# Patient Record
Sex: Female | Born: 1940 | Race: Black or African American | Hispanic: No | Smoking: Former smoker
Health system: Southern US, Community
[De-identification: ages and names within clinical notes are randomized; demographics above are authoritative.]

## PROBLEM LIST (undated history)

## (undated) DIAGNOSIS — I10 Essential (primary) hypertension: Secondary | ICD-10-CM

## (undated) HISTORY — PX: NECK SURGERY: SHX720

## (undated) HISTORY — PX: BACK SURGERY: SHX140

---

## 2004-06-09 ENCOUNTER — Ambulatory Visit: Payer: Self-pay | Admitting: Internal Medicine

## 2005-02-03 ENCOUNTER — Emergency Department: Payer: Self-pay | Admitting: Emergency Medicine

## 2005-06-26 ENCOUNTER — Emergency Department: Payer: Self-pay | Admitting: Emergency Medicine

## 2005-07-14 DIAGNOSIS — K219 Gastro-esophageal reflux disease without esophagitis: Secondary | ICD-10-CM | POA: Insufficient documentation

## 2005-07-30 ENCOUNTER — Other Ambulatory Visit: Payer: Self-pay

## 2005-07-31 ENCOUNTER — Inpatient Hospital Stay: Payer: Self-pay | Admitting: Internal Medicine

## 2006-02-05 ENCOUNTER — Emergency Department: Payer: Self-pay | Admitting: Emergency Medicine

## 2006-05-30 IMAGING — CT CT ABD-PELV W/ CM
1 of 2 series · 15 of 32 positions shown, 19 images · non-contrast
Comparison: none

REASON FOR EXAM: Abdominal pain
COMMENTS:

[Series 3: abdomen · axial · 0.67mm/px · z∈[-352,+24]mm · 15 of 51 slices shown, 19 images]
[im 2/51  soft-tissue]
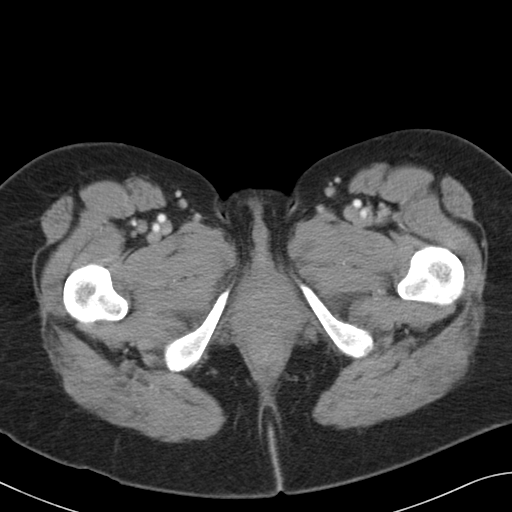
[im 2/51  bone]
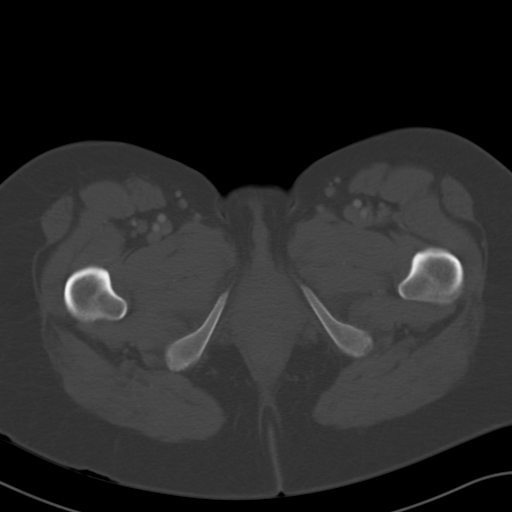
[im 6/51  soft-tissue]
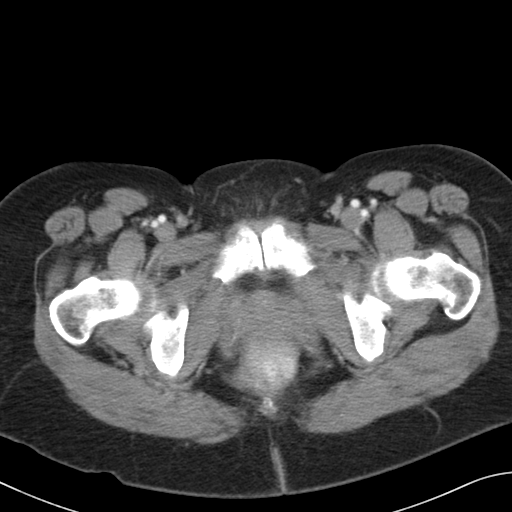
[im 10/51  soft-tissue]
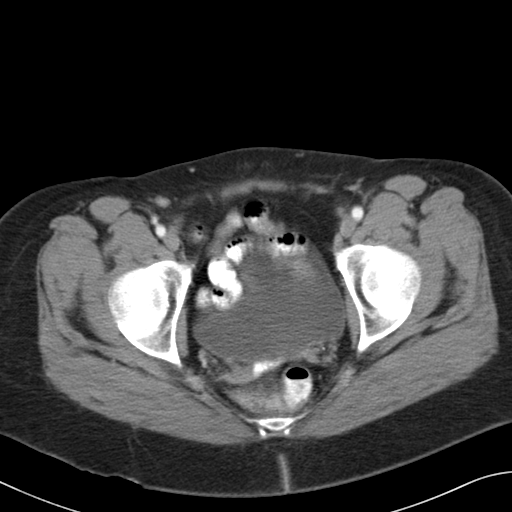
[im 14/51  soft-tissue]
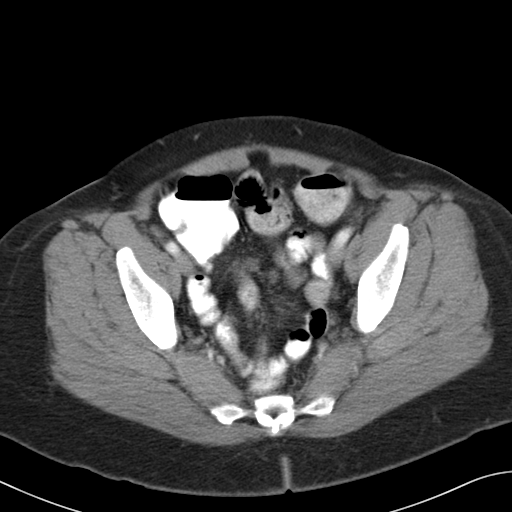
[im 18/51  soft-tissue]
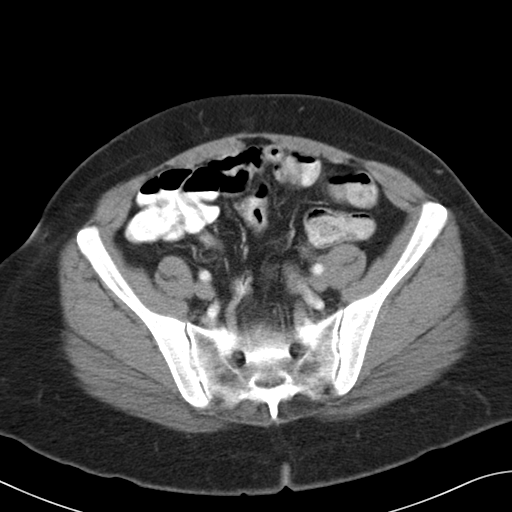
[im 22/51  soft-tissue]
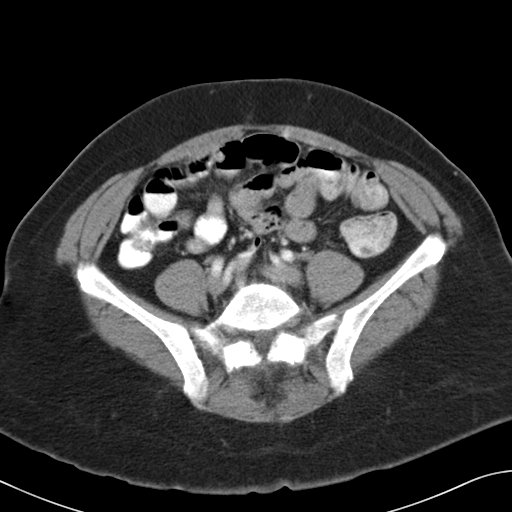
[im 26/51  soft-tissue]
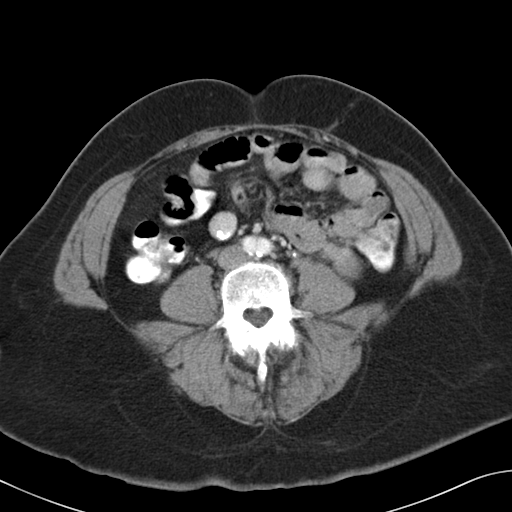
[im 29/51  soft-tissue]
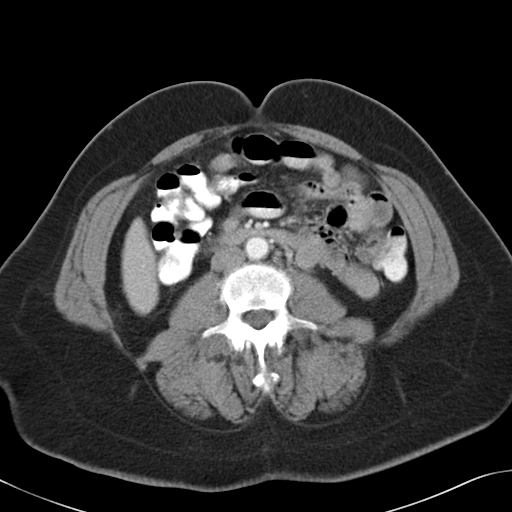
[im 33/51  soft-tissue]
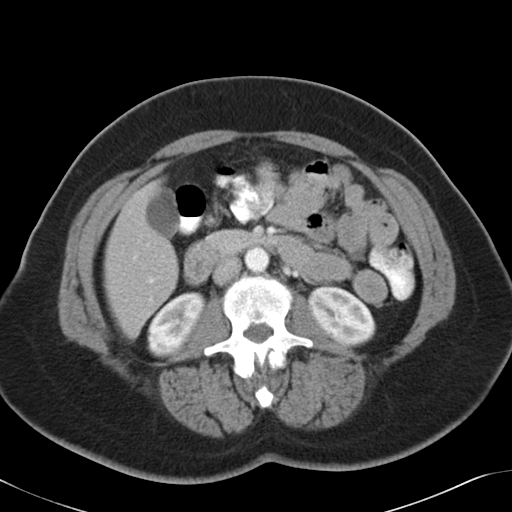
[im 33/51  bone]
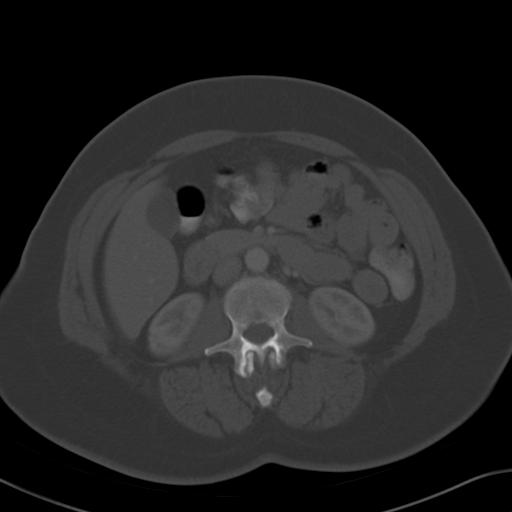
[im 37/51  soft-tissue]
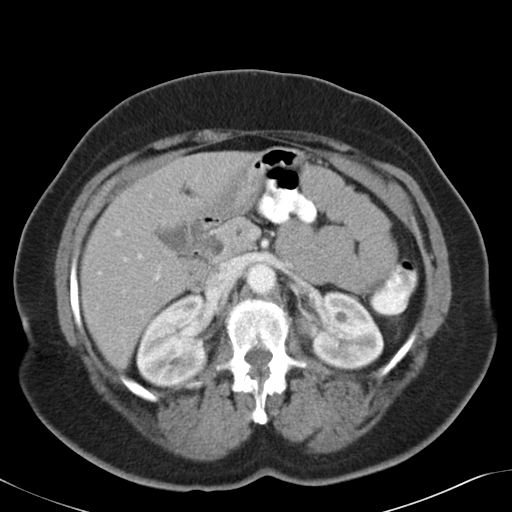
[im 41/51  soft-tissue]
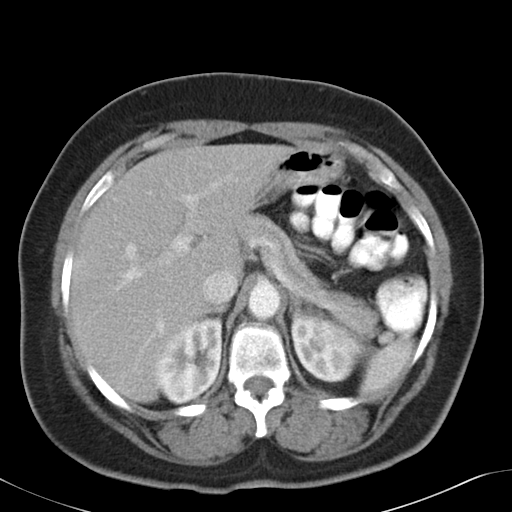
[im 43/51  lung]
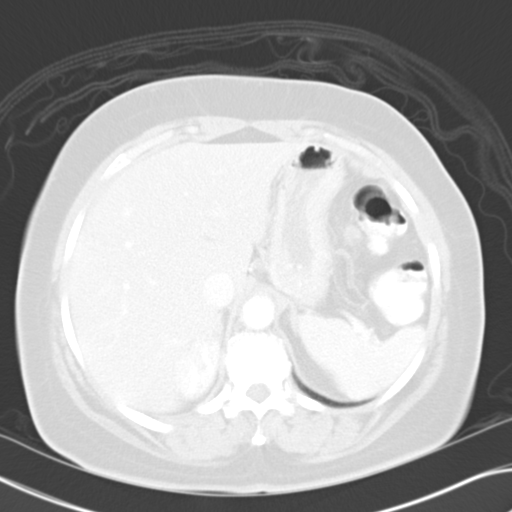
[im 45/51  soft-tissue]
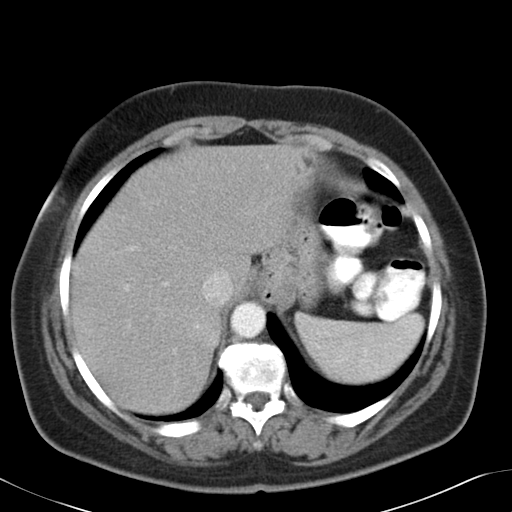
[im 45/51  lung]
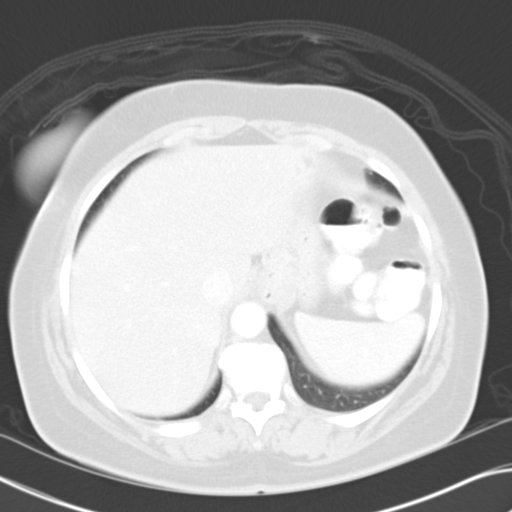
[im 47/51  lung]
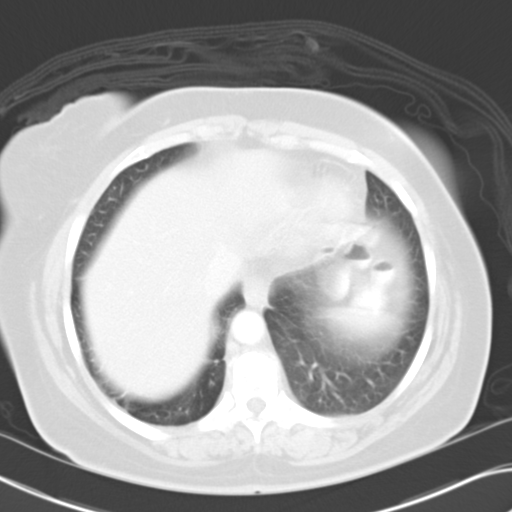
[im 49/51  soft-tissue]
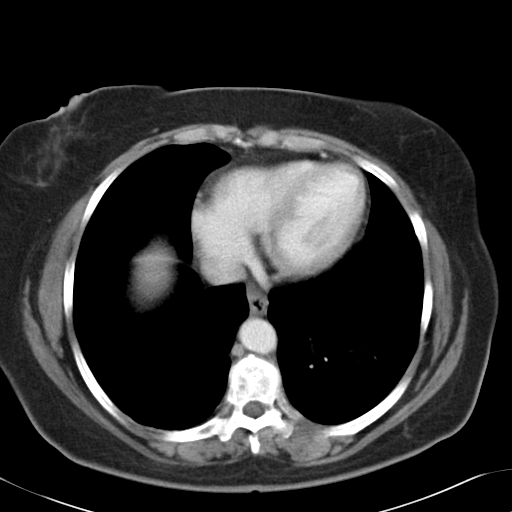
[im 49/51  lung]
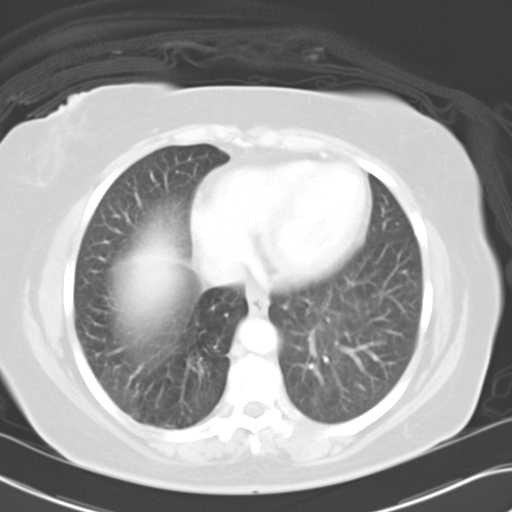

[15 of 32 positions shown; findings below may reference images not displayed]

PROCEDURE:     CT  - CT ABDOMEN / PELVIS  W  - July 31, 2005  [DATE]

RESULT:     The patient is complaining of abdominal discomfort. The patient
has undergone prior hysterectomy.

The patient received 100 ml of [id number] for this study.

The liver is normal in density with no mass or ductal dilation. The
gallbladder is adequately distended with no evidence of calcified stones.
The pancreas, spleen, stomach, adrenal glands and kidneys are normal. The
periaortic and pericaval regions also are normal. The caliber of the
thoracic aorta is normal. The urinary bladder is partially distended and
grossly normal. The partially contrast filled loops of small and large bowel
exhibit no acute abnormality. The lung bases are clear.
IMPRESSION: 1.     I do not see evidence of acute intra-abdominal abnormality.
Specifically, I do not see evidence of acute diverticulitis or appendicitis
or other acute bowel abnormality.
2.     There are no findings to suggest acute hepatobiliary disease.
3.     I see no acute abnormality of the kidneys with no evidence of
obstruction of the urinary tracts seen either.

A preliminary report was sent to the Emergency Room at the conclusion of the
study.

## 2006-05-31 ENCOUNTER — Ambulatory Visit: Payer: Self-pay | Admitting: Urology

## 2007-06-01 ENCOUNTER — Other Ambulatory Visit: Payer: Self-pay

## 2007-06-01 ENCOUNTER — Observation Stay: Payer: Self-pay | Admitting: Internal Medicine

## 2007-11-04 ENCOUNTER — Ambulatory Visit: Payer: Self-pay | Admitting: Internal Medicine

## 2009-08-15 ENCOUNTER — Ambulatory Visit: Payer: Self-pay | Admitting: Internal Medicine

## 2009-10-21 ENCOUNTER — Emergency Department: Payer: Self-pay | Admitting: Emergency Medicine

## 2009-11-13 DIAGNOSIS — M4712 Other spondylosis with myelopathy, cervical region: Secondary | ICD-10-CM | POA: Insufficient documentation

## 2013-11-17 ENCOUNTER — Ambulatory Visit: Payer: Self-pay | Admitting: Family Medicine

## 2014-11-08 ENCOUNTER — Other Ambulatory Visit: Payer: Self-pay | Admitting: Family Medicine

## 2014-11-08 DIAGNOSIS — R1011 Right upper quadrant pain: Secondary | ICD-10-CM

## 2014-11-14 ENCOUNTER — Ambulatory Visit
Admission: RE | Admit: 2014-11-14 | Discharge: 2014-11-14 | Disposition: A | Discharge status: Home / Self Care | Payer: Medicare Other | Source: Ambulatory Visit | Attending: Family Medicine | Admitting: Family Medicine

## 2014-11-14 DIAGNOSIS — K219 Gastro-esophageal reflux disease without esophagitis: Secondary | ICD-10-CM | POA: Insufficient documentation

## 2014-11-14 DIAGNOSIS — R1011 Right upper quadrant pain: Secondary | ICD-10-CM

## 2015-03-31 ENCOUNTER — Emergency Department: Payer: Medicare Other

## 2015-03-31 ENCOUNTER — Encounter: Payer: Self-pay | Admitting: Emergency Medicine

## 2015-03-31 ENCOUNTER — Emergency Department
Admission: EM | Admit: 2015-03-31 | Discharge: 2015-03-31 | Disposition: A | Discharge status: Home / Self Care | Payer: Medicare Other | Attending: Emergency Medicine | Admitting: Emergency Medicine

## 2015-03-31 DIAGNOSIS — J40 Bronchitis, not specified as acute or chronic: Secondary | ICD-10-CM

## 2015-03-31 DIAGNOSIS — R0602 Shortness of breath: Secondary | ICD-10-CM | POA: Diagnosis present

## 2015-03-31 DIAGNOSIS — I10 Essential (primary) hypertension: Secondary | ICD-10-CM | POA: Insufficient documentation

## 2015-03-31 DIAGNOSIS — Z87891 Personal history of nicotine dependence: Secondary | ICD-10-CM | POA: Diagnosis not present

## 2015-03-31 DIAGNOSIS — R0600 Dyspnea, unspecified: Secondary | ICD-10-CM

## 2015-03-31 DIAGNOSIS — J209 Acute bronchitis, unspecified: Secondary | ICD-10-CM | POA: Insufficient documentation

## 2015-03-31 HISTORY — DX: Essential (primary) hypertension: I10

## 2015-03-31 LAB — CBC WITH DIFFERENTIAL/PLATELET
BASOS ABS: 0.1 10*3/uL (ref 0–0.1)
Basophils Relative: 1 %
Eosinophils Absolute: 0.1 10*3/uL (ref 0–0.7)
Eosinophils Relative: 1 %
HEMATOCRIT: 42 % (ref 35.0–47.0)
Hemoglobin: 13.5 g/dL (ref 12.0–16.0)
LYMPHS PCT: 24 %
Lymphs Abs: 2.7 10*3/uL (ref 1.0–3.6)
MCH: 26.3 pg (ref 26.0–34.0)
MCHC: 32.1 g/dL (ref 32.0–36.0)
MCV: 81.7 fL (ref 80.0–100.0)
MONO ABS: 0.6 10*3/uL (ref 0.2–0.9)
MONOS PCT: 6 %
NEUTROS ABS: 7.6 10*3/uL — AB (ref 1.4–6.5)
Neutrophils Relative %: 68 %
Platelets: 213 10*3/uL (ref 150–440)
RBC: 5.14 MIL/uL (ref 3.80–5.20)
RDW: 14.1 % (ref 11.5–14.5)
WBC: 11.1 10*3/uL — ABNORMAL HIGH (ref 3.6–11.0)

## 2015-03-31 LAB — COMPREHENSIVE METABOLIC PANEL
ALK PHOS: 111 U/L (ref 38–126)
ALT: 17 U/L (ref 14–54)
AST: 28 U/L (ref 15–41)
Albumin: 3.9 g/dL (ref 3.5–5.0)
Anion gap: 7 (ref 5–15)
BUN: 9 mg/dL (ref 6–20)
CALCIUM: 9.3 mg/dL (ref 8.9–10.3)
CO2: 25 mmol/L (ref 22–32)
CREATININE: 0.78 mg/dL (ref 0.44–1.00)
Chloride: 105 mmol/L (ref 101–111)
GFR calc non Af Amer: >60 mL/min (ref >60)
GLUCOSE: 149 mg/dL — AB (ref 65–99)
Potassium: 2.8 mmol/L — CL (ref 3.5–5.1)
SODIUM: 137 mmol/L (ref 135–145)
Total Bilirubin: 0.8 mg/dL (ref 0.3–1.2)
Total Protein: 7.5 g/dL (ref 6.5–8.1)

## 2015-03-31 LAB — TROPONIN I: Troponin I: <0.03 ng/mL (ref <0.031)

## 2015-03-31 LAB — BRAIN NATRIURETIC PEPTIDE: B Natriuretic Peptide: 46 pg/mL (ref 0.0–100.0)

## 2015-03-31 MED ORDER — LEVOFLOXACIN 500 MG PO TABS: 500.0000 mg | ORAL_TABLET | Freq: Every day | ORAL | Status: AC

## 2015-03-31 MED ORDER — IPRATROPIUM-ALBUTEROL 0.5-2.5 (3) MG/3ML IN SOLN
3.0000 mL | Freq: Once | RESPIRATORY_TRACT | Status: AC
  Administered 2015-03-31: 3 mL via RESPIRATORY_TRACT

## 2015-03-31 MED ORDER — IPRATROPIUM-ALBUTEROL 0.5-2.5 (3) MG/3ML IN SOLN
3.0000 mL | Freq: Once | RESPIRATORY_TRACT | Status: AC
  Administered 2015-03-31: 3 mL via RESPIRATORY_TRACT
  Filled 2015-03-31: qty 3

## 2015-03-31 MED ORDER — HYDROCOD POLST-CPM POLST ER 10-8 MG/5ML PO SUER: 5.0000 mL | Freq: Two times a day (BID) | ORAL | Status: AC

## 2015-03-31 MED ORDER — ALBUTEROL SULFATE HFA 108 (90 BASE) MCG/ACT IN AERS: 2.0000 | INHALATION_SPRAY | Freq: Four times a day (QID) | RESPIRATORY_TRACT | Status: AC | PRN

## 2015-03-31 MED ORDER — IPRATROPIUM-ALBUTEROL 0.5-2.5 (3) MG/3ML IN SOLN
3.0000 mL | Freq: Once | RESPIRATORY_TRACT | Status: DC
  Filled 2015-03-31: qty 3

## 2015-03-31 MED ORDER — LEVOFLOXACIN 750 MG PO TABS
750.0000 mg | ORAL_TABLET | Freq: Once | ORAL | Status: AC
  Administered 2015-03-31: 750 mg via ORAL
  Filled 2015-03-31: qty 1

## 2015-03-31 MED ORDER — POTASSIUM CHLORIDE CRYS ER 20 MEQ PO TBCR
60.0000 meq | EXTENDED_RELEASE_TABLET | Freq: Once | ORAL | Status: AC
  Administered 2015-03-31: 60 meq via ORAL
  Filled 2015-03-31: qty 3

## 2015-03-31 MED ORDER — PREDNISONE 50 MG PO TABS: ORAL_TABLET | ORAL | Status: AC

## 2015-03-31 MED ORDER — HYDROCOD POLST-CPM POLST ER 10-8 MG/5ML PO SUER
5.0000 mL | Freq: Once | ORAL | Status: AC
  Administered 2015-03-31: 5 mL via ORAL
  Filled 2015-03-31: qty 5

## 2015-03-31 MED ORDER — PREDNISONE 20 MG PO TABS
60.0000 mg | ORAL_TABLET | Freq: Once | ORAL | Status: AC
  Administered 2015-03-31: 60 mg via ORAL
  Filled 2015-03-31: qty 3

## 2015-03-31 MED ORDER — ALBUTEROL SULFATE (2.5 MG/3ML) 0.083% IN NEBU: 5.0000 mg | INHALATION_SOLUTION | Freq: Once | RESPIRATORY_TRACT | Status: DC

## 2015-03-31 NOTE — Discharge Instructions (Signed)
Shortness of Breath °Shortness of breath means you have trouble breathing. It could also mean that you have a medical problem. You should get immediate medical care for shortness of breath. °CAUSES  °· Not enough oxygen in the air such as with high altitudes or a smoke-filled room. °· Certain lung diseases, infections, or problems. °· Heart disease or conditions, such as angina or heart failure. °· Low red blood cells (anemia). °· Poor physical fitness, which can cause shortness of breath when you exercise. °· Chest or back injuries or stiffness. °· Being overweight. °· Smoking. °· Anxiety, which can make you feel like you are not getting enough air. °DIAGNOSIS  °Serious medical problems can often be found during your physical exam. Tests may also be done to determine why you are having shortness of breath. Tests may include: °· Chest X-rays. °· Lung function tests. °· Blood tests. °· An electrocardiogram (ECG). °· An ambulatory electrocardiogram. An ambulatory ECG records your heartbeat patterns over a 24-hour period. °· Exercise testing. °· A transthoracic echocardiogram (TTE). During echocardiography, sound waves are used to evaluate how blood flows through your heart. °· A transesophageal echocardiogram (TEE). °· Imaging scans. °Your health care provider may not be able to find a cause for your shortness of breath after your exam. In this case, it is important to have a follow-up exam with your health care provider as directed.  °TREATMENT  °Treatment for shortness of breath depends on the cause of your symptoms and can vary greatly. °HOME CARE INSTRUCTIONS  °· Do not smoke. Smoking is a common cause of shortness of breath. If you smoke, ask for help to quit. °· Avoid being around chemicals or things that may bother your breathing, such as paint fumes and dust. °· Rest as needed. Slowly resume your usual activities. °· If medicines were prescribed, take them as directed for the full length of time directed. This  includes oxygen and any inhaled medicines. °· Keep all follow-up appointments as directed by your health care provider. °SEEK MEDICAL CARE IF:  °· Your condition does not improve in the time expected. °· You have a hard time doing your normal activities even with rest. °· You have any new symptoms. °SEEK IMMEDIATE MEDICAL CARE IF:  °· Your shortness of breath gets worse. °· You feel light-headed, faint, or develop a cough not controlled with medicines. °· You start coughing up blood. °· You have pain with breathing. °· You have chest pain or pain in your arms, shoulders, or abdomen. °· You have a fever. °· You are unable to walk up stairs or exercise the way you normally do. °MAKE SURE YOU: °· Understand these instructions. °· Will watch your condition. °· Will get help right away if you are not doing well or get worse. °Document Released: 03/10/2001 Document Revised: 06/20/2013 Document Reviewed: 08/31/2011 °ExitCare® Patient Information ©2015 [employer]. This information is not intended to replace advice given to you by your health care provider. Make sure you discuss any questions you have with your health care provider. °Acute Bronchitis °Bronchitis is inflammation of the airways that extend from the windpipe into the lungs (bronchi). The inflammation often causes mucus to develop. This leads to a cough, which is the most common symptom of bronchitis.  °In acute bronchitis, the condition usually develops suddenly and goes away over time, usually in a couple weeks. Smoking, allergies, and asthma can make bronchitis worse. Repeated episodes of bronchitis may cause further lung problems.  °CAUSES °Acute bronchitis is   most often caused by the same virus that causes a cold. The virus can spread from person to person (contagious) through coughing, sneezing, and touching contaminated objects. °SIGNS AND SYMPTOMS  °· Cough.   °· Fever.   °· Coughing up mucus.   °· Body aches.   °· Chest congestion.   °· Chills.    °· Shortness of breath.   °· Sore throat.   °DIAGNOSIS  °Acute bronchitis is usually diagnosed through a physical exam. Your health care provider will also ask you questions about your medical history. Tests, such as chest X-rays, are sometimes done to rule out other conditions.  °TREATMENT  °Acute bronchitis usually goes away in a couple weeks. Oftentimes, no medical treatment is necessary. Medicines are sometimes given for relief of fever or cough. Antibiotic medicines are usually not needed but may be prescribed in certain situations. In some cases, an inhaler may be recommended to help reduce shortness of breath and control the cough. A cool mist vaporizer may also be used to help thin bronchial secretions and make it easier to clear the chest.  °HOME CARE INSTRUCTIONS °· Get plenty of rest.   °· Drink enough fluids to keep your urine clear or pale yellow (unless you have a medical condition that requires fluid restriction). Increasing fluids may help thin your respiratory secretions (sputum) and reduce chest congestion, and it will prevent dehydration.   °· Take medicines only as directed by your health care provider. °· If you were prescribed an antibiotic medicine, finish it all even if you start to feel better. °· Avoid smoking and secondhand smoke. Exposure to cigarette smoke or irritating chemicals will make bronchitis worse. If you are a smoker, consider using nicotine gum or skin patches to help control withdrawal symptoms. Quitting smoking will help your lungs heal faster.   °· Reduce the chances of another bout of acute bronchitis by washing your hands frequently, avoiding people with cold symptoms, and trying not to touch your hands to your mouth, nose, or eyes.   °· Keep all follow-up visits as directed by your health care provider.   °SEEK MEDICAL CARE IF: °Your symptoms do not improve after 1 week of treatment.  °SEEK IMMEDIATE MEDICAL CARE IF: °· You develop an increased fever or chills.    °· You have chest pain.   °· You have severe shortness of breath. °· You have bloody sputum.   °· You develop dehydration. °· You faint or repeatedly feel like you are going to pass out. °· You develop repeated vomiting. °· You develop a severe headache. °MAKE SURE YOU:  °· Understand these instructions. °· Will watch your condition. °· Will get help right away if you are not doing well or get worse. °Document Released: 07/23/2004 Document Revised: 10/30/2013 Document Reviewed: 12/06/2012 °ExitCare® Patient Information ©2015 [employer]. This information is not intended to replace advice given to you by your health care provider. Make sure you discuss any questions you have with your health care provider. ° °

## 2015-03-31 NOTE — ED Notes (Signed)
Pt states approx 1 mo ago she began having cold like symptoms and progressively getting worse. Pt states has progressed to cough and SOB at this time. Pt respirations noted to be even and unlabored at this time.

## 2015-03-31 NOTE — ED Provider Notes (Signed)
[facility] Emergency Department Provider Note     Time seen: ----------------------------------------- 9:38 PM on 03/31/2015 -----------------------------------------    I have reviewed the triage vital signs and the nursing notes.   HISTORY  Chief Complaint Cough and Shortness of Breath    HPI Amanda Baxter is a 74 y.o. female who presents ER for cough and cold symptoms for the last month. Patient states they're progressively worsening, over-the-counter medications are not helping. She has cough and shortness of breath at this time. She denies any fevers or chills.   Past Medical History  Diagnosis Date  . Hypertension     There are no active problems to display for this patient.   Past Surgical History  Procedure Laterality Date  . Neck surgery    . Back surgery      Allergies Review of patient's allergies indicates no known allergies.  Social History Social History  Substance Use Topics  . Smoking status: Former Games developer  . Smokeless tobacco: Never Used  . Alcohol Use: No    Review of Systems Constitutional: Negative for fever. Eyes: Negative for visual changes. ENT: Negative for sore throat. Cardiovascular: Negative for chest pain. Respiratory: Positive shortness of breath and cough Gastrointestinal: Negative for abdominal pain, vomiting and diarrhea. Genitourinary: Negative for dysuria. Musculoskeletal: Negative for back pain. Skin: Negative for rash. Neurological: Negative for headaches, focal weakness or numbness.  10-point ROS otherwise negative.  ____________________________________________   PHYSICAL EXAM:  VITAL SIGNS: ED Triage Vitals  Enc Vitals Group     BP 03/31/15 1519 130/74 mmHg     Pulse Rate 03/31/15 1519 73     Resp 03/31/15 1519 20     Temp 03/31/15 1519 98.4 F (36.9 C)     Temp Source 03/31/15 1519 Oral     SpO2 03/31/15 1519 95 %     Weight 03/31/15 1519 154 lb (69.854 kg)     Height  03/31/15 1519  (1.549 m)     Head Cir --      Peak Flow --      Pain Score 03/31/15 1519 8     Pain Loc --      Pain Edu? --      Excl. in GC? --     Constitutional: Alert and oriented. Well appearing and in no distress. Eyes: Conjunctivae are normal. PERRL. Normal extraocular movements. ENT   Head: Normocephalic and atraumatic.   Nose: No congestion/rhinnorhea.   Mouth/Throat: Mucous membranes are moist. Hoarse voice   Neck: No stridor. Cardiovascular: Normal rate, regular rhythm. Normal and symmetric distal pulses are present in all extremities. No murmurs, rubs, or gallops. Respiratory: Bilateral wheezing with rhonchi. Normal respiratory effort. Gastrointestinal: Soft and nontender. No distention. No abdominal bruits.  Musculoskeletal: Nontender with normal range of motion in all extremities. No joint effusions.  No lower extremity tenderness nor edema. Neurologic:  Normal speech and language. No gross focal neurologic deficits are appreciated. Speech is normal. No gait instability. Skin:  Skin is warm, dry and intact. No rash noted. Psychiatric: Mood and affect are normal. Speech and behavior are normal. Patient exhibits appropriate insight and judgment. ____________________________________________  EKG: Interpreted by me. Sinus rhythm with a rate of 79 bpm, ST and T-wave changes concerning for inferior and anterior lateral ischemia. His normal axis.  ____________________________________________  ED COURSE:  Pertinent labs & imaging results that were available during my care of the patient were reviewed by me and considered in my medical decision making (see  chart for details). Patient will need cardiac labs, she received DuoNeb and oral steroids. ____________________________________________    LABS (pertinent positives/negatives)  Labs Reviewed  CBC WITH DIFFERENTIAL/PLATELET - Abnormal; Notable for the following:    WBC 11.1 (*)    Neutro Abs 7.6 (*)     All other components within normal limits  COMPREHENSIVE METABOLIC PANEL - Abnormal; Notable for the following:    Potassium 2.8 (*)    Glucose, Bld 149 (*)    All other components within normal limits  BRAIN NATRIURETIC PEPTIDE  TROPONIN I    RADIOLOGY Images were viewed by me  Chest x-ray  IMPRESSION: Cardiomegaly and moderate pulmonary interstitial thickening. Favored to be the sequelae of smoking/chronic bronchitis. Mild pulmonary venous congestion cannot be excluded. No overt congestive failure. ____________________________________________  FINAL ASSESSMENT AND PLAN  Dyspnea, cough, bronchitis  Plan: Patient with labs and imaging as dictated above. Patient is improved after DuoNeb times, oral steroids, Tussionex and Levaquin. She's had a month of cough which does not appear to be CHF related. EKG is unchanged from prior, she is encouraged to have close follow-up with her doctor in 2 days for reevaluation. Emily Filbert, MD   Emily Filbert, MD 03/31/15 [phone]

## 2015-05-03 ENCOUNTER — Other Ambulatory Visit: Payer: Self-pay | Admitting: Family Medicine

## 2015-05-03 DIAGNOSIS — Z1231 Encounter for screening mammogram for malignant neoplasm of breast: Secondary | ICD-10-CM

## 2017-06-02 DIAGNOSIS — H93A2 Pulsatile tinnitus, left ear: Secondary | ICD-10-CM | POA: Insufficient documentation

## 2017-06-02 DIAGNOSIS — H90A31 Mixed conductive and sensorineural hearing loss, unilateral, right ear with restricted hearing on the contralateral side: Secondary | ICD-10-CM | POA: Insufficient documentation

## 2017-10-05 ENCOUNTER — Other Ambulatory Visit: Payer: Self-pay | Admitting: Family Medicine

## 2017-10-05 DIAGNOSIS — M8580 Other specified disorders of bone density and structure, unspecified site: Secondary | ICD-10-CM

## 2017-12-21 ENCOUNTER — Emergency Department
Admission: EM | Admit: 2017-12-21 | Discharge: 2017-12-21 | Disposition: A | Discharge status: Home / Self Care | Payer: Medicare Other | Attending: Emergency Medicine | Admitting: Emergency Medicine

## 2017-12-21 ENCOUNTER — Emergency Department: Payer: Medicare Other

## 2017-12-21 ENCOUNTER — Other Ambulatory Visit: Payer: Self-pay

## 2017-12-21 ENCOUNTER — Encounter: Payer: Self-pay | Admitting: Emergency Medicine

## 2017-12-21 DIAGNOSIS — I10 Essential (primary) hypertension: Secondary | ICD-10-CM | POA: Insufficient documentation

## 2017-12-21 DIAGNOSIS — Z87891 Personal history of nicotine dependence: Secondary | ICD-10-CM | POA: Insufficient documentation

## 2017-12-21 DIAGNOSIS — R42 Dizziness and giddiness: Secondary | ICD-10-CM

## 2017-12-21 DIAGNOSIS — Z79899 Other long term (current) drug therapy: Secondary | ICD-10-CM | POA: Diagnosis not present

## 2017-12-21 DIAGNOSIS — H6641 Suppurative otitis media, unspecified, right ear: Secondary | ICD-10-CM | POA: Insufficient documentation

## 2017-12-21 LAB — BASIC METABOLIC PANEL
Anion gap: 7 (ref 5–15)
BUN: 18 mg/dL (ref 8–23)
CHLORIDE: 106 mmol/L (ref 98–111)
CO2: 26 mmol/L (ref 22–32)
CREATININE: 0.76 mg/dL (ref 0.44–1.00)
Calcium: 9.4 mg/dL (ref 8.9–10.3)
Glucose, Bld: 96 mg/dL (ref 70–99)
POTASSIUM: 3.9 mmol/L (ref 3.5–5.1)
SODIUM: 139 mmol/L (ref 135–145)

## 2017-12-21 LAB — CBC
HCT: 40.3 % (ref 35.0–47.0)
Hemoglobin: 13.4 g/dL (ref 12.0–16.0)
MCH: 27 pg (ref 26.0–34.0)
MCHC: 33.1 g/dL (ref 32.0–36.0)
MCV: 81.6 fL (ref 80.0–100.0)
Platelets: 174 10*3/uL (ref 150–440)
RBC: 4.94 MIL/uL (ref 3.80–5.20)
RDW: 13.9 % (ref 11.5–14.5)
WBC: 7.1 10*3/uL (ref 3.6–11.0)

## 2017-12-21 MED ORDER — MECLIZINE HCL 25 MG PO TABS
25.0000 mg | ORAL_TABLET | Freq: Once | ORAL | Status: AC
  Administered 2017-12-21: 25 mg via ORAL
  Filled 2017-12-21: qty 1

## 2017-12-21 MED ORDER — ONDANSETRON HCL 4 MG/2ML IJ SOLN
4.0000 mg | Freq: Once | INTRAMUSCULAR | Status: AC
  Administered 2017-12-21: 4 mg via INTRAVENOUS
  Filled 2017-12-21: qty 2

## 2017-12-21 MED ORDER — AMOXICILLIN 500 MG PO CAPS: 500.0000 mg | ORAL_CAPSULE | Freq: Three times a day (TID) | ORAL | 0 refills | Status: AC

## 2017-12-21 MED ORDER — MECLIZINE HCL 25 MG PO TABS: 25.0000 mg | ORAL_TABLET | Freq: Three times a day (TID) | ORAL | 0 refills | Status: AC | PRN

## 2017-12-21 NOTE — ED Notes (Signed)
Patient politely refusing to give urine sample, states she wants to go home. Dr. Alphonzo LemmingsMcShane aware. Patient up to walk to ensure patient able to walk prior to discharge with Dr. Alphonzo LemmingsMcShane in the room. Patient ambulates without difficulty.

## 2017-12-21 NOTE — ED Notes (Signed)
Patient transported to CT 

## 2017-12-21 NOTE — ED Triage Notes (Signed)
Pt reports that she started getting dizzy today. She states that she notices it when she changes positions.

## 2017-12-21 NOTE — ED Provider Notes (Addendum)
[facility] Emergency Department Provider Note  ____________________________________________   I have reviewed the triage vital signs and the nursing notes. Where available I have reviewed prior notes and, if possible and indicated, outside hospital notes.    HISTORY  Chief Complaint Dizziness    HPI Amanda Baxter is a 77 y.o. female who presents today complaining of feeling dizzy.  Patient states she has had true vertigo symptoms since this afternoon, may be 4:00.  Patient states she was fine when she laid down began to feel dizzy.  Patient has a history of "ear problems".  She has been to an audiologist several times for tinnitus.  After I do noticed that her right TM is opaque and erythematous, she states that she has been having ear pain on that right side for at least a week.  Patient is a very vague historian.  She denies any focal numbness or weakness.  She denies any headache or stiff neck or fever, she denies any nausea or vomiting, she has a true sensation of vertigo, is worse when she changes position, at this time she is not really feeling it very much it seems to be getting better on its own.  It was worse earlier today.  She stated she felt a little unsteady on her feet when she tried to walk after began.  It began suddenly.  No headache.  She has chronic tingling and issues with her right arm from her neck, however that is unchanged.  Family attest that she is at her baseline in all respects to the extent that they can determine.  Level 5 chart caveat; no further history available due to patient status.    Past Medical History:  Diagnosis Date  . Hypertension     There are no active problems to display for this patient.   Past Surgical History:  Procedure Laterality Date  . BACK SURGERY    . NECK SURGERY      Prior to Admission medications   Medication Sig Start Date End Date Taking? Authorizing Provider  albuterol (PROVENTIL HFA;VENTOLIN  HFA) 108 (90 BASE) MCG/ACT inhaler Inhale 2 puffs into the lungs every 6 (six) hours as needed for wheezing or shortness of breath. 03/31/15   Emily FilbertWilliams, Jonathan E, MD  chlorpheniramine-HYDROcodone (TUSSIONEX PENNKINETIC ER) 10-8 MG/5ML SUER Take 5 mLs by mouth 2 (two) times daily. 03/31/15   Emily FilbertWilliams, Jonathan E, MD  predniSONE (DELTASONE) 50 MG tablet One tablet by mouth daily 03/31/15   Emily FilbertWilliams, Jonathan E, MD    Allergies Patient has no known allergies.  No family history on file.  Social History Social History   Tobacco Use  . Smoking status: Former Games developermoker  . Smokeless tobacco: Never Used  Substance Use Topics  . Alcohol use: No  . Drug use: No    Review of Systems Constitutional: No fever/chills Eyes: No visual changes. ENT: No sore throat. No stiff neck no neck pain Cardiovascular: Denies chest pain. Respiratory: Denies shortness of breath. Gastrointestinal:   no vomiting.  No diarrhea.  No constipation. Genitourinary: Negative for dysuria. Musculoskeletal: Negative lower extremity swelling Skin: Negative for rash. Neurological: Negative for severe headaches, focal weakness or numbness.   ____________________________________________   PHYSICAL EXAM:  VITAL SIGNS: ED Triage Vitals  Enc Vitals Group     BP 12/21/17 1840 (!) 155/75     Pulse Rate 12/21/17 1840 68     Resp 12/21/17 1840 17     Temp 12/21/17 1840 98.4 F (36.9 C)  Temp Source 12/21/17 1840 Oral     SpO2 12/21/17 1840 94 %     Weight 12/21/17 1838 150 lb (68 kg)     Height 12/21/17 1838 5\' 1"  (1.549 m)     Head Circumference --      Peak Flow --      Pain Score 12/21/17 1838 0     Pain Loc --      Pain Edu? --      Excl. in GC? --     Constitutional: Alert and oriented. Well appearing and in no acute distress. Eyes: Conjunctivae are normal Head: Atraumatic HEENT: No congestion/rhinnorhea. Mucous membranes are moist.  Oropharynx non-erythematous, left TM is normal, right TM is opaque,  erythematous, with minimal landmarks. Neck:   Nontender with no meningismus, no masses, no stridor Cardiovascular: Normal rate, regular rhythm. Grossly normal heart sounds.  Good peripheral circulation. Respiratory: Normal respiratory effort.  No retractions. Lungs CTAB. Abdominal: Soft and nontender. No distention. No guarding no rebound Back:  There is no focal tenderness or step off.  there is no midline tenderness there are no lesions noted. there is no CVA tenderness Musculoskeletal: No lower extremity tenderness, no upper extremity tenderness. No joint effusions, no DVT signs strong distal pulses no edema Neurologic: Cranial nerves II through XII are grossly intact 5 out of 5 strength bilateral upper and lower extremity. Finger to nose within normal limits heel to shin within normal limits, speech is normal with no word finding difficulty or dysarthria, reflexes symmetric, pupils are equally round and reactive to light, there is no pronator drift, sensation is normal, vision is intact to confrontation, gait is deferred, there is no nystagmus, normal neurologic exam Skin:  Skin is warm, dry and intact. No rash noted. Psychiatric: Mood and affect are normal. Speech and behavior are normal.  ____________________________________________   LABS (all labs ordered are listed, but only abnormal results are displayed)  Labs Reviewed  BASIC METABOLIC PANEL  CBC  URINALYSIS, COMPLETE (UACMP) WITH MICROSCOPIC  CBG MONITORING, ED    Pertinent labs  results that were available during my care of the patient were reviewed by me and considered in my medical decision making (see chart for details). ____________________________________________  EKG  I personally interpreted any EKGs ordered by me or triage Sinus rhythm rate 72 bpm no acute ST elevation or depression, LAD noted.  No acute ischemia nonspecific ST changes ____________________________________________  RADIOLOGY  Pertinent labs &  imaging results that were available during my care of the patient were reviewed by me and considered in my medical decision making (see chart for details). If possible, patient and/or family made aware of any abnormal findings.  No results found. ____________________________________________    PROCEDURES  Procedure(s) performed: None  Procedures  Critical Care performed: None  ____________________________________________   INITIAL IMPRESSION / ASSESSMENT AND PLAN / ED COURSE  Pertinent labs & imaging results that were available during my care of the patient were reviewed by me and considered in my medical decision making (see chart for details).  Patient here with true vertigo, neurologic exam is reassuring, she has what appears to be a right otitis media.  Obtain a CT scan given her age with low suspicion for centrally mediated process.  Her symptoms are almost better.  With clear otitis media on one side, it is most more likely this is a peripheral vertigo.  In any event, given her age we will still scanner.  This will also give [place] insight as  whether there is infection in the mastoid air cells.  She does not tenderness there.  We will check basic blood work, urinalysis and look for other possible causes but this again is true vertigo with no other neurologic signs or symptoms which appears to be fatigable and at this time is not particularly bothersome to her.  We will give her Antivert as a precaution.  Likely I will be sending her home with antibiotics if CT is negative I do not think emergent MRI will be indicated as long as we can get her symptomatically better we will have her follow-up with ENT.  ----------------------------------------- 9:02 PM on 12/21/2017 -----------------------------------------  After Antivert all of her dizziness resolved she is up and ambulatory I did want to wait for urinalysis but she refuses she and family want to go home, she has no further complaints  we will send her home with antibiotics for her ear infection, and close outpatient follow-up with ENT.  Return precautions follow-up given and understood.  They understand limitations of the work-up without allowing me to do urinalysis.  Family will stay with her tonight.  Neurovascularly intact at this time with a negative NIH stroke scale.    ____________________________________________   FINAL CLINICAL IMPRESSION(S) / ED DIAGNOSES  Final diagnoses:  None      This chart was dictated using voice recognition software.  Despite best efforts to proofread,  errors can occur which can change meaning.      Jeanmarie Plant, MD 12/21/17 1859    Jeanmarie Plant, MD 12/21/17 2102    Jeanmarie Plant, MD 12/21/17 2103

## 2019-01-23 DIAGNOSIS — R058 Other specified cough: Secondary | ICD-10-CM | POA: Insufficient documentation

## 2019-01-23 DIAGNOSIS — R0602 Shortness of breath: Secondary | ICD-10-CM | POA: Insufficient documentation

## 2019-01-23 DIAGNOSIS — R062 Wheezing: Secondary | ICD-10-CM | POA: Insufficient documentation

## 2019-01-23 DIAGNOSIS — Z720 Tobacco use: Secondary | ICD-10-CM | POA: Insufficient documentation

## 2019-02-09 ENCOUNTER — Other Ambulatory Visit: Payer: Self-pay | Admitting: Obstetrics and Gynecology

## 2019-02-09 DIAGNOSIS — J449 Chronic obstructive pulmonary disease, unspecified: Secondary | ICD-10-CM

## 2019-03-10 ENCOUNTER — Other Ambulatory Visit: Admission: RE | Admit: 2019-03-10 | Payer: Medicare Other | Source: Ambulatory Visit

## 2019-03-15 ENCOUNTER — Ambulatory Visit: Payer: Medicare Other

## 2020-02-06 ENCOUNTER — Other Ambulatory Visit: Payer: Self-pay | Admitting: Obstetrics and Gynecology

## 2020-02-06 DIAGNOSIS — J449 Chronic obstructive pulmonary disease, unspecified: Secondary | ICD-10-CM

## 2020-02-14 ENCOUNTER — Telehealth: Payer: Self-pay

## 2020-02-14 NOTE — Telephone Encounter (Signed)
Received referral for initial lung cancer screening scan from Dr. Lindley Magnus.  I contacted patient and she said her daughter Esau Grew handles her medical affairs and she gave me phone number for her daughter [phone].  (This contact added to demographic information).  Patient's daughter states she is aware of the referral to the lung program as she was at the appt with her mother and Dr. Rockie Neighbours when the program was discussed. I explained the program and daughter states she and her mother had already discussed and they want to participate.  Smoking history obtained-patient is a current smoker and has been smoking since age 30.  She smokes 1 pack per day.  Patient denies signs of lung cancer such as weight loss or hemoptysis. Patient denies comorbidity that would prevent curative treatment if lung cancer were found. 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  Patient is scheduled for shared decision making visit and CT scan on 02/27/2020 at 2:00pm.  Address of outpatient imaging center given to daughter.

## 2020-02-15 ENCOUNTER — Other Ambulatory Visit: Payer: Self-pay | Admitting: *Deleted

## 2020-02-15 DIAGNOSIS — Z87891 Personal history of nicotine dependence: Secondary | ICD-10-CM

## 2020-02-15 DIAGNOSIS — Z122 Encounter for screening for malignant neoplasm of respiratory organs: Secondary | ICD-10-CM

## 2020-02-27 ENCOUNTER — Inpatient Hospital Stay: Payer: Medicare Other | Attending: Nurse Practitioner | Admitting: Nurse Practitioner

## 2020-02-27 ENCOUNTER — Other Ambulatory Visit: Payer: Self-pay

## 2020-02-27 ENCOUNTER — Ambulatory Visit
Admission: RE | Admit: 2020-02-27 | Discharge: 2020-02-27 | Disposition: A | Discharge status: Home / Self Care | Payer: Medicare Other | Source: Ambulatory Visit | Attending: Nurse Practitioner | Admitting: Nurse Practitioner

## 2020-02-27 DIAGNOSIS — Z122 Encounter for screening for malignant neoplasm of respiratory organs: Secondary | ICD-10-CM | POA: Insufficient documentation

## 2020-02-27 DIAGNOSIS — I700 Atherosclerosis of aorta: Secondary | ICD-10-CM | POA: Insufficient documentation

## 2020-02-27 DIAGNOSIS — F17200 Nicotine dependence, unspecified, uncomplicated: Secondary | ICD-10-CM | POA: Diagnosis not present

## 2020-02-27 DIAGNOSIS — R911 Solitary pulmonary nodule: Secondary | ICD-10-CM | POA: Diagnosis not present

## 2020-02-27 DIAGNOSIS — Z87891 Personal history of nicotine dependence: Secondary | ICD-10-CM | POA: Diagnosis not present

## 2020-02-27 DIAGNOSIS — J439 Emphysema, unspecified: Secondary | ICD-10-CM | POA: Diagnosis not present

## 2020-02-27 NOTE — Progress Notes (Signed)
Virtual Visit via Video Enabled Telemedicine Note   I connected with Amanda Baxter on 02/27/20 at 1:45 PM EST by video enabled telemedicine visit and verified that I am speaking with the correct person using two identifiers.   I discussed the limitations, risks, security and privacy concerns of performing an evaluation and management service by telemedicine and the availability of in-person appointments. I also discussed with the patient that there may be a patient responsible charge related to this service. The patient expressed understanding and agreed to proceed.   Other persons participating in the visit and their role in the encounter: Burgess Estelle, RN- checking in patient & navigation  Patient's location: [place]  Provider's location: Clinic  Chief Complaint: Low Dose CT Screening  Patient agreed to evaluation by telemedicine to discuss shared decision making for consideration of low dose CT lung cancer screening.    In accordance with CMS guidelines, patient has met eligibility criteria including age, absence of signs or symptoms of lung cancer.  Social History   Tobacco Use  . Smoking status: Former Research scientist (life sciences)  . Smokeless tobacco: Never Used  Substance Use Topics  . Alcohol use: No     A shared decision-making session was conducted prior to the performance of CT scan. This includes one or more decision aids, includes benefits and harms of screening, follow-up diagnostic testing, over-diagnosis, false positive rate, and total radiation exposure.   Counseling on the importance of adherence to annual lung cancer LDCT screening, impact of co-morbidities, and ability or willingness to undergo diagnosis and treatment is imperative for compliance of the program.   Counseling on the importance of continued smoking cessation for former smokers; the importance of smoking cessation for current smokers, and information about tobacco cessation interventions have been given to patient  including [place] and [employer] [place] programs.   Written order for lung cancer screening with LDCT has been given to the patient and any and all questions have been answered to the best of my abilities.    Yearly follow up will be coordinated by Burgess Estelle, Thoracic Navigator.  I discussed the assessment and treatment plan with the patient. The patient was provided an opportunity to ask questions and all were answered. The patient agreed with the plan and demonstrated an understanding of the instructions.   The patient was advised to call back or seek an in-person evaluation if the symptoms worsen or if the condition fails to improve as anticipated.   I provided 15 minutes of face-to-face video visit time during this encounter, and > 50% was spent counseling as documented under my assessment & plan.   Beckey Rutter, DNP, AGNP-C [place] [facility] [phone] (clinic)

## 2020-03-07 ENCOUNTER — Telehealth: Payer: Self-pay | Admitting: *Deleted

## 2020-03-07 NOTE — Telephone Encounter (Signed)
Notified patient's daughter of LDCT lung cancer screening program results with recommendation for 6 month follow up imaging. Also notified of incidental findings noted below and is encouraged to discuss further with PCP who will receive a copy of this note and/or the CT report. Patient's daughter verbalizes understanding.   Specifically discussed [place] recommendation for thyroid nodule evaluation. Contacted Noah Wouk's office at [facility] to verbally notify them as well.  IMPRESSION: Lung-RADS 3S, probably benign findings. Short-term follow-up in 6 months is recommended with repeat low-dose chest CT without contrast (please use the following order, "CT CHEST LCS NODULE FOLLOW-UP W/O CM").  Dominant 7.6 mm left lower lobe nodule.  A Lung-RADS "S" modifier is used due to the presence of a potentially clinically significant finding, in this case, an 18 mm right thyroid nodule. Recommend thyroid US (ref: J Am Coll Radiol. 2015 Feb;12(2): 143-50).  Aortic Atherosclerosis (ICD10-I70.0) and Emphysema (ICD10-J43.9).

## 2020-03-28 ENCOUNTER — Other Ambulatory Visit: Payer: Self-pay | Admitting: Obstetrics and Gynecology

## 2020-03-28 DIAGNOSIS — E0590 Thyrotoxicosis, unspecified without thyrotoxic crisis or storm: Secondary | ICD-10-CM

## 2020-03-28 DIAGNOSIS — E041 Nontoxic single thyroid nodule: Secondary | ICD-10-CM

## 2020-04-08 ENCOUNTER — Ambulatory Visit
Admission: RE | Admit: 2020-04-08 | Discharge: 2020-04-08 | Disposition: A | Discharge status: Home / Self Care | Payer: Medicare Other | Source: Ambulatory Visit | Attending: Obstetrics and Gynecology | Admitting: Obstetrics and Gynecology

## 2020-04-08 ENCOUNTER — Other Ambulatory Visit: Payer: Self-pay

## 2020-04-08 DIAGNOSIS — E0590 Thyrotoxicosis, unspecified without thyrotoxic crisis or storm: Secondary | ICD-10-CM | POA: Diagnosis present

## 2020-04-08 DIAGNOSIS — E041 Nontoxic single thyroid nodule: Secondary | ICD-10-CM | POA: Insufficient documentation

## 2020-08-20 ENCOUNTER — Other Ambulatory Visit: Payer: Self-pay | Admitting: Obstetrics and Gynecology

## 2020-08-20 DIAGNOSIS — E040 Nontoxic diffuse goiter: Secondary | ICD-10-CM

## 2020-08-20 DIAGNOSIS — R918 Other nonspecific abnormal finding of lung field: Secondary | ICD-10-CM

## 2020-08-22 ENCOUNTER — Telehealth: Payer: Self-pay

## 2020-08-22 DIAGNOSIS — Z87891 Personal history of nicotine dependence: Secondary | ICD-10-CM

## 2020-08-22 DIAGNOSIS — R918 Other nonspecific abnormal finding of lung field: Secondary | ICD-10-CM

## 2020-08-23 NOTE — Telephone Encounter (Signed)
Contacted and scheduled for lcs nodule follow up scan. Patient is a current smoker with a 67.25 pack year history.

## 2020-09-06 ENCOUNTER — Other Ambulatory Visit: Payer: Self-pay

## 2020-09-06 ENCOUNTER — Ambulatory Visit
Admission: RE | Admit: 2020-09-06 | Discharge: 2020-09-06 | Disposition: A | Discharge status: Home / Self Care | Payer: Medicare Other | Source: Ambulatory Visit | Attending: Obstetrics and Gynecology | Admitting: Obstetrics and Gynecology

## 2020-09-06 DIAGNOSIS — Z122 Encounter for screening for malignant neoplasm of respiratory organs: Secondary | ICD-10-CM | POA: Insufficient documentation

## 2020-09-06 DIAGNOSIS — I2510 Atherosclerotic heart disease of native coronary artery without angina pectoris: Secondary | ICD-10-CM | POA: Insufficient documentation

## 2020-09-06 DIAGNOSIS — I700 Atherosclerosis of aorta: Secondary | ICD-10-CM | POA: Insufficient documentation

## 2020-09-06 DIAGNOSIS — J479 Bronchiectasis, uncomplicated: Secondary | ICD-10-CM | POA: Insufficient documentation

## 2020-09-06 DIAGNOSIS — R918 Other nonspecific abnormal finding of lung field: Secondary | ICD-10-CM | POA: Insufficient documentation

## 2020-09-06 DIAGNOSIS — J439 Emphysema, unspecified: Secondary | ICD-10-CM | POA: Insufficient documentation

## 2020-09-06 DIAGNOSIS — F17210 Nicotine dependence, cigarettes, uncomplicated: Secondary | ICD-10-CM | POA: Insufficient documentation

## 2020-09-06 DIAGNOSIS — Z87891 Personal history of nicotine dependence: Secondary | ICD-10-CM

## 2020-09-12 ENCOUNTER — Encounter
Admission: RE | Admit: 2020-09-12 | Discharge: 2020-09-12 | Disposition: A | Discharge status: Home / Self Care | Payer: Medicare Other | Source: Ambulatory Visit | Attending: Obstetrics and Gynecology | Admitting: Obstetrics and Gynecology

## 2020-09-12 ENCOUNTER — Other Ambulatory Visit: Payer: Self-pay

## 2020-09-12 ENCOUNTER — Ambulatory Visit: Payer: Medicare Other

## 2020-09-12 DIAGNOSIS — E040 Nontoxic diffuse goiter: Secondary | ICD-10-CM | POA: Diagnosis present

## 2020-09-12 MED ORDER — SODIUM IODIDE I-123 7.4 MBQ CAPS
300.0000 | ORAL_CAPSULE | Freq: Once | ORAL | Status: AC
  Administered 2020-09-12: 300 via ORAL

## 2020-09-13 ENCOUNTER — Encounter: Payer: Self-pay | Admitting: *Deleted

## 2020-09-13 ENCOUNTER — Encounter
Admission: RE | Admit: 2020-09-13 | Discharge: 2020-09-13 | Disposition: A | Discharge status: Home / Self Care | Payer: Medicare Other | Source: Ambulatory Visit | Attending: Obstetrics and Gynecology | Admitting: Obstetrics and Gynecology

## 2020-09-18 ENCOUNTER — Other Ambulatory Visit: Payer: Medicare Other

## 2020-09-19 ENCOUNTER — Other Ambulatory Visit: Payer: Medicare Other

## 2020-11-15 DIAGNOSIS — J479 Bronchiectasis, uncomplicated: Secondary | ICD-10-CM | POA: Insufficient documentation

## 2020-11-15 DIAGNOSIS — J432 Centrilobular emphysema: Secondary | ICD-10-CM | POA: Insufficient documentation

## 2020-11-15 DIAGNOSIS — R918 Other nonspecific abnormal finding of lung field: Secondary | ICD-10-CM | POA: Insufficient documentation

## 2021-03-24 ENCOUNTER — Other Ambulatory Visit: Payer: Self-pay | Admitting: Obstetrics and Gynecology

## 2021-03-24 DIAGNOSIS — E0590 Thyrotoxicosis, unspecified without thyrotoxic crisis or storm: Secondary | ICD-10-CM

## 2021-03-24 DIAGNOSIS — E041 Nontoxic single thyroid nodule: Secondary | ICD-10-CM

## 2021-10-08 ENCOUNTER — Telehealth: Payer: Self-pay | Admitting: *Deleted

## 2021-10-08 NOTE — Telephone Encounter (Signed)
ATC x 2 to set up Yearly Lung CA CT Scan. Primary # VM full, alternate # no VM set up. ?

## 2021-10-17 ENCOUNTER — Other Ambulatory Visit (HOSPITAL_COMMUNITY): Payer: Self-pay | Admitting: Obstetrics and Gynecology

## 2021-10-17 ENCOUNTER — Other Ambulatory Visit: Payer: Self-pay | Admitting: Obstetrics and Gynecology

## 2021-10-17 DIAGNOSIS — R918 Other nonspecific abnormal finding of lung field: Secondary | ICD-10-CM

## 2021-10-20 ENCOUNTER — Other Ambulatory Visit (HOSPITAL_COMMUNITY): Payer: Self-pay | Admitting: Obstetrics and Gynecology

## 2021-10-20 DIAGNOSIS — Z780 Asymptomatic menopausal state: Secondary | ICD-10-CM

## 2021-11-03 ENCOUNTER — Ambulatory Visit
Admission: RE | Admit: 2021-11-03 | Discharge: 2021-11-03 | Disposition: A | Discharge status: Home / Self Care | Payer: Medicare Other | Source: Ambulatory Visit | Attending: Obstetrics and Gynecology | Admitting: Obstetrics and Gynecology

## 2021-11-03 DIAGNOSIS — R918 Other nonspecific abnormal finding of lung field: Secondary | ICD-10-CM | POA: Insufficient documentation

## 2022-07-20 ENCOUNTER — Other Ambulatory Visit: Payer: Self-pay

## 2022-07-20 DIAGNOSIS — I10 Essential (primary) hypertension: Secondary | ICD-10-CM | POA: Insufficient documentation

## 2022-07-21 ENCOUNTER — Ambulatory Visit: Payer: Medicare Other | Admitting: Gastroenterology

## 2022-07-21 ENCOUNTER — Encounter: Payer: Self-pay | Admitting: Gastroenterology

## 2022-09-14 ENCOUNTER — Ambulatory Visit: Payer: 59 | Admitting: Gastroenterology

## 2022-09-14 ENCOUNTER — Encounter: Payer: Self-pay | Admitting: Gastroenterology

## 2022-09-14 NOTE — Progress Notes (Deleted)
Jonathon Bellows MD, MRCP(U.K) [address]  [place]  [place], [place] [address]  Main: [phone]  Fax: [phone]   Gastroenterology Consultation  Referring Provider:     [facility] [place] Physician:  [facility] [place] Primary Gastroenterologist:  Dr. Jonathon Bellows  Reason for Consultation:    Dysphagia        HPI:   Amanda Baxter is a 82 y.o. y/o female referred for dysphagia.    11/03/2021: stable pulmonary nodules and bronchiectasis.    Past Medical History:  Diagnosis Date   Hypertension     Past Surgical History:  Procedure Laterality Date   BACK SURGERY     NECK SURGERY      Prior to Admission medications   Medication Sig Start Date End Date Taking? Authorizing Provider  albuterol (PROVENTIL HFA;VENTOLIN HFA) 108 (90 BASE) MCG/ACT inhaler Inhale 2 puffs into the lungs every 6 (six) hours as needed for wheezing or shortness of breath. 03/31/15   Earleen Newport, MD  amoxicillin (AMOXIL) 500 MG capsule Take 1 capsule (500 mg total) by mouth 3 (three) times daily. 12/21/17   Schuyler Amor, MD  cetirizine (ZYRTEC) 10 MG tablet Take 10 mg by mouth at bedtime. 08/22/20   [provider]  chlorpheniramine-HYDROcodone (TUSSIONEX PENNKINETIC ER) 10-8 MG/5ML SUER Take 5 mLs by mouth 2 (two) times daily. 03/31/15   Earleen Newport, MD  fluticasone (FLONASE) 50 MCG/ACT nasal spray Place 2 sprays into both nostrils daily.    [provider]  lisinopril (ZESTRIL) 10 MG tablet Take 10 mg by mouth daily.    [provider]  meclizine (ANTIVERT) 25 MG tablet Take 1 tablet (25 mg total) by mouth 3 (three) times daily as needed. 12/21/17   Schuyler Amor, MD  oxyCODONE-acetaminophen (PERCOCET) 10-325 MG tablet Take 1 tablet by mouth every 4 (four) hours as needed. 07/11/22   [provider]  predniSONE (DELTASONE) 50 MG tablet One tablet by mouth daily 03/31/15   Earleen Newport, MD  tiotropium  (SPIRIVA HANDIHALER) 18 MCG inhalation capsule Place 18 mcg into inhaler and inhale daily. 07/29/21 07/29/22  [provider]  Grant Ruts INHUB 250-50 MCG/ACT AEPB Inhale 1 puff into the lungs 2 (two) times daily. 05/18/22   [provider]    No family history on file.   Social History   Tobacco Use   Smoking status: Former   Smokeless tobacco: Never  Substance Use Topics   Alcohol use: No   Drug use: No    Allergies as of 09/14/2022   (No Known Allergies)    Review of Systems:    All systems reviewed and negative except where noted in HPI.   Physical Exam:  There were no vitals taken for this visit. No LMP recorded. Patient has had a hysterectomy. Psych:  Alert and cooperative. Normal mood and affect. General:   Alert,  Well-developed, well-nourished, pleasant and cooperative in NAD Head:  Normocephalic and atraumatic. Eyes:  Sclera clear, no icterus.   Conjunctiva pink. Ears:  Normal auditory acuity. Neck:  Supple; no masses or thyromegaly. Lungs:  Respirations even and unlabored.  Clear throughout to auscultation.   No wheezes, crackles, or rhonchi. No acute distress. Heart:  Regular rate and rhythm; no murmurs, clicks, rubs, or gallops. Abdomen:  Normal bowel sounds.  No bruits.  Soft, non-tender and non-distended without masses, hepatosplenomegaly or hernias noted.  No guarding or rebound tenderness.    Neurologic:  Alert and oriented x3;  grossly normal neurologically. Psych:  Alert and cooperative. Normal mood and affect.  Imaging Studies: No results found.  Assessment and Plan:   Amanda Baxter is a 82 y.o. y/o female has been referred for ***  Follow up in ***  Dr Jonathon Bellows MD,MRCP(U.K)    BP check ***

## 2024-04-25 ENCOUNTER — Ambulatory Visit: Payer: Self-pay

## 2024-04-25 NOTE — Telephone Encounter (Signed)
  FYI Only or Action Required?: FYI only for provider.  Patient is followed in Pulmonology for COPD, last seen on new patient.  Called Nurse Triage reporting Cough.  Symptoms began several weeks ago.  Interventions attempted: Rescue inhaler.  Symptoms are: unchanged.  Triage Disposition: See PCP When Office is Open (Within 3 Days)  Patient/caregiver understands and will follow disposition?: Yes  Patient/caregiver understands and will follow disposition?: Yes Copied from CRM [id number]. Topic: Clinical - Red Word Triage >> Apr 25, 2024  9:45 AM Rozanna MATSU wrote: Red Word that prompted transfer to Nurse Triage: chest with cough, runny nose. Daughter Waldo Seeds Reason for Disposition  Cough has been present for > 3 weeks  Answer Assessment - Initial Assessment Questions 1. ONSET: When did the cough begin?      Flare up, unsure of when 2. SEVERITY: How bad is the cough today?      worsening 3. SPUTUM: Describe the color of your sputum (e.g., none, dry cough; clear, white, yellow, green)     No sputum 4. HEMOPTYSIS: Are you coughing up any blood? If Yes, ask: How much? (e.g., flecks, streaks, tablespoons, etc.)     No blood 5. DIFFICULTY BREATHING: Are you having difficulty breathing? If Yes, ask: How bad is it? (e.g., mild, moderate, severe)      Sometimes she gets short winded, but no difficulty breathing 6. FEVER: Do you have a fever? If Yes, ask: What is your temperature, how was it measured, and when did it start?     denies 7. CARDIAC HISTORY: Do you have any history of heart disease? (e.g., heart attack, congestive heart failure)      HTN 8. LUNG HISTORY: Do you have any history of lung disease?  (e.g., pulmonary embolus, asthma, emphysema)     COPD 9. PE RISK FACTORS: Do you have a history of blood clots? (or: recent major surgery, recent prolonged travel, bedridden)     denies 10. OTHER SYMPTOMS: Do you have any other symptoms? (e.g., runny  nose, wheezing, chest pain)       Runny nose, occasional right sided chest tightness  Protocols used: Cough - Acute Non-Productive-A-AH

## 2024-05-10 ENCOUNTER — Ambulatory Visit: Admitting: Pulmonary Disease
